# Patient Record
Sex: Male | Born: 1960 | Race: White | Hispanic: No | Marital: Married | Smoking: Never smoker
Health system: Southern US, Community
[De-identification: ages and names within clinical notes are randomized; demographics above are authoritative.]

## PROBLEM LIST (undated history)

## (undated) DIAGNOSIS — I10 Essential (primary) hypertension: Secondary | ICD-10-CM

## (undated) DIAGNOSIS — M1990 Unspecified osteoarthritis, unspecified site: Secondary | ICD-10-CM

## (undated) DIAGNOSIS — E785 Hyperlipidemia, unspecified: Secondary | ICD-10-CM

## (undated) DIAGNOSIS — K219 Gastro-esophageal reflux disease without esophagitis: Secondary | ICD-10-CM

## (undated) DIAGNOSIS — C801 Malignant (primary) neoplasm, unspecified: Secondary | ICD-10-CM

## (undated) HISTORY — DX: Malignant (primary) neoplasm, unspecified: C80.1

## (undated) HISTORY — DX: Hyperlipidemia, unspecified: E78.5

## (undated) HISTORY — DX: Essential (primary) hypertension: I10

## (undated) HISTORY — DX: Unspecified osteoarthritis, unspecified site: M19.90

## (undated) HISTORY — DX: Gastro-esophageal reflux disease without esophagitis: K21.9

---

## 1995-08-01 HISTORY — PX: KNEE SURGERY: SHX244

## 2005-06-28 ENCOUNTER — Ambulatory Visit: Payer: Self-pay | Admitting: Family Medicine

## 2005-07-05 ENCOUNTER — Ambulatory Visit: Payer: Self-pay | Admitting: Family Medicine

## 2005-07-31 HISTORY — PX: PROSTATECTOMY: SHX69

## 2007-01-02 ENCOUNTER — Encounter (INDEPENDENT_AMBULATORY_CARE_PROVIDER_SITE_OTHER): Payer: Self-pay | Admitting: Urology

## 2007-01-02 ENCOUNTER — Inpatient Hospital Stay (HOSPITAL_COMMUNITY): Admission: RE | Admit: 2007-01-02 | Discharge: 2007-01-03 | Payer: Self-pay | Admitting: Urology

## 2007-01-20 ENCOUNTER — Emergency Department (HOSPITAL_COMMUNITY): Admission: EM | Admit: 2007-01-20 | Discharge: 2007-01-20 | Payer: Self-pay | Admitting: Emergency Medicine

## 2008-07-09 IMAGING — CT CT PELVIS W/ CM
2 of 5 series · 16 of 46 positions shown, 18 images · IV contrast (APPLIED)
Comparison: None.

CLINICAL DATA: Acute onset right upper quadrant abdominal pain and nausea.
Rectal pain. History of radical prostatectomy in the past for prostate cancer.

CT ABDOMEN AND PELVIS WITH CONTRAST 01/20/2007:
TECHNIQUE: Multidetector helical CT of the abdomen and pelvis was performed
during bolus administration of intravenous contrast. Oral contrast was given.
Delayed imaging through the kidneys was performed.
Contrast:  125 cc Omnipaque 300.

[Series 2: abd_pel 5.0 b40f st · axial · 0.73mm/px · z∈[-472,-72]mm · 13 of 91 slices shown, 15 images]
[im 6/91  soft-tissue]
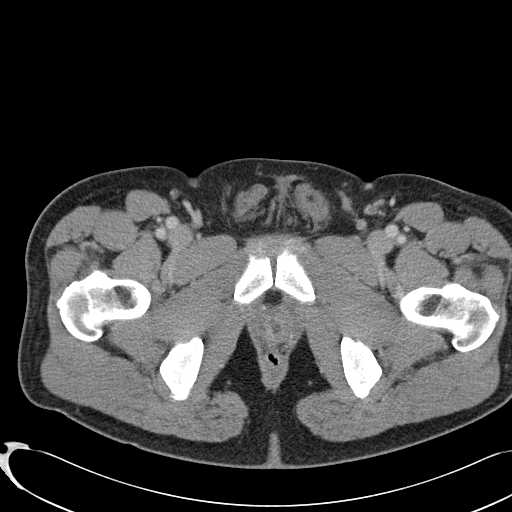
[im 6/91  bone]
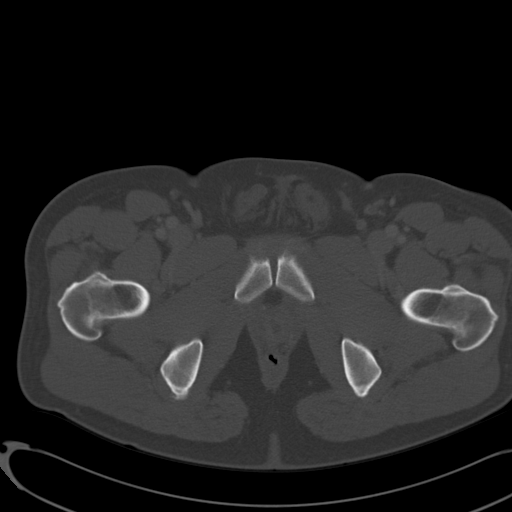
[im 11/91  soft-tissue]
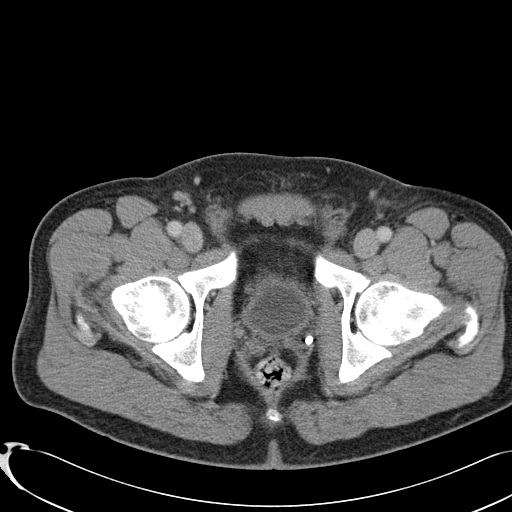
[im 21/91  soft-tissue]
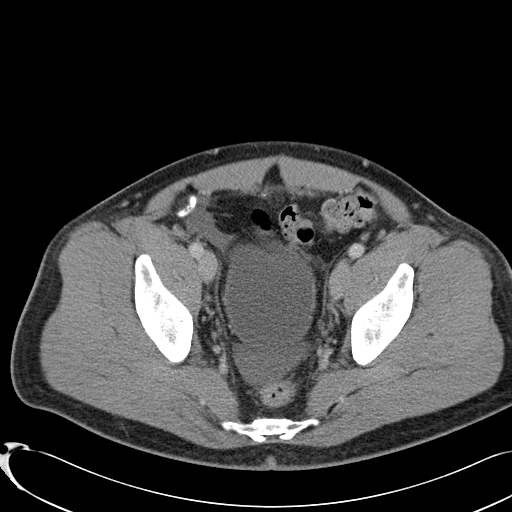
[im 26/91  soft-tissue]
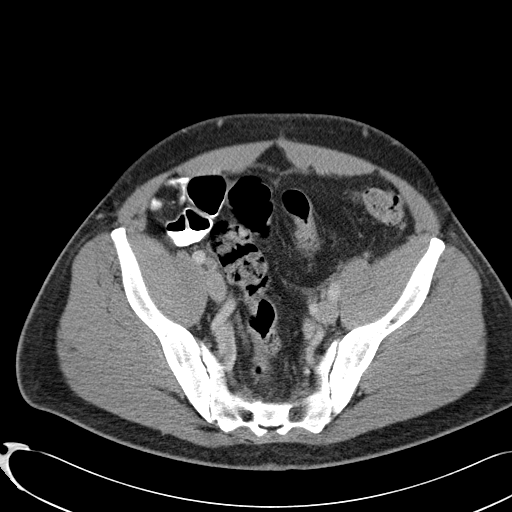
[im 31/91  soft-tissue]
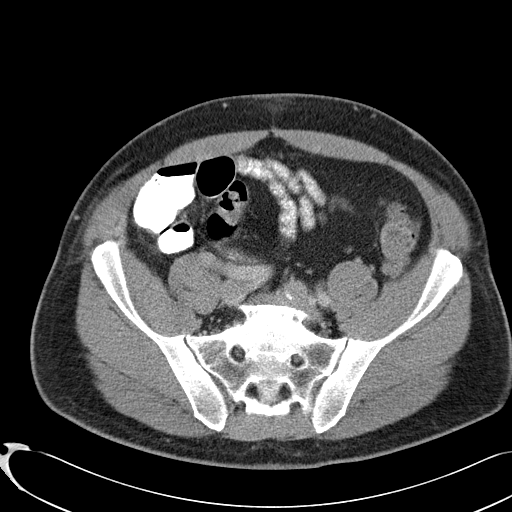
[im 41/91  soft-tissue]
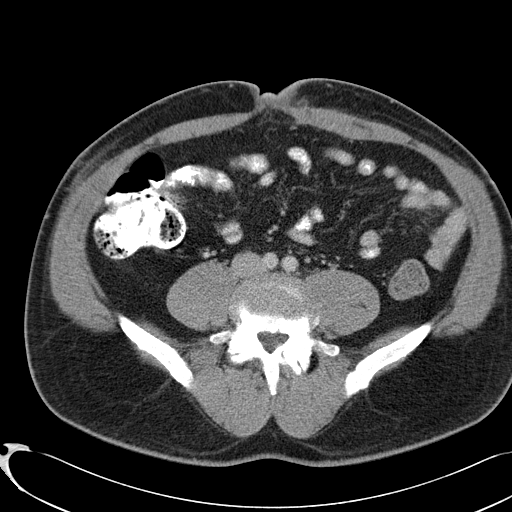
[im 46/91  soft-tissue]
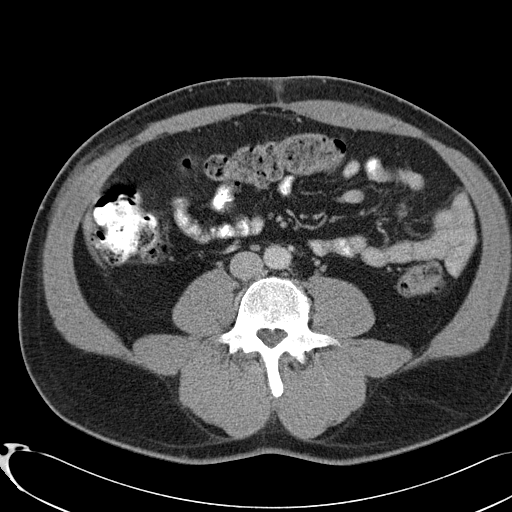
[im 51/91  soft-tissue]
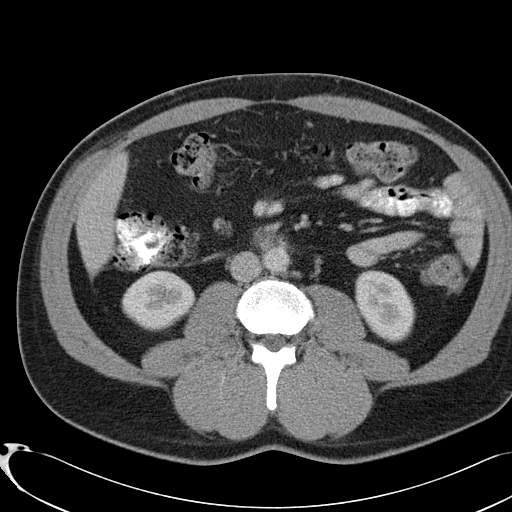
[im 61/91  soft-tissue]
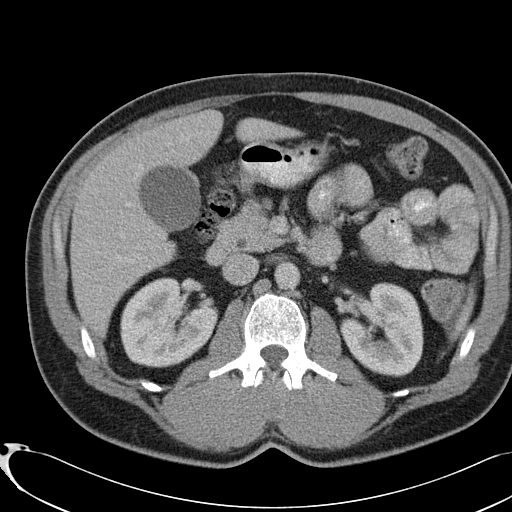
[im 61/91  bone]
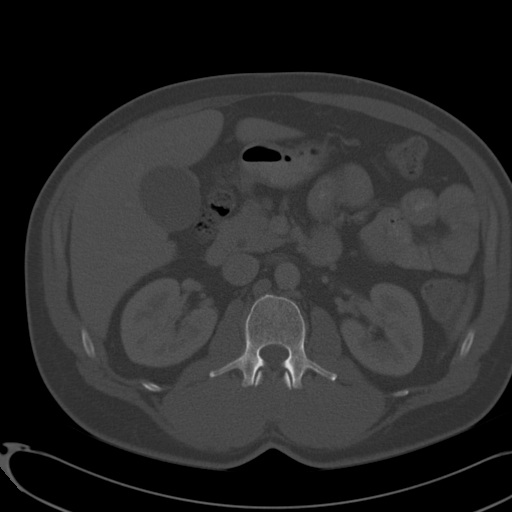
[im 66/91  soft-tissue]
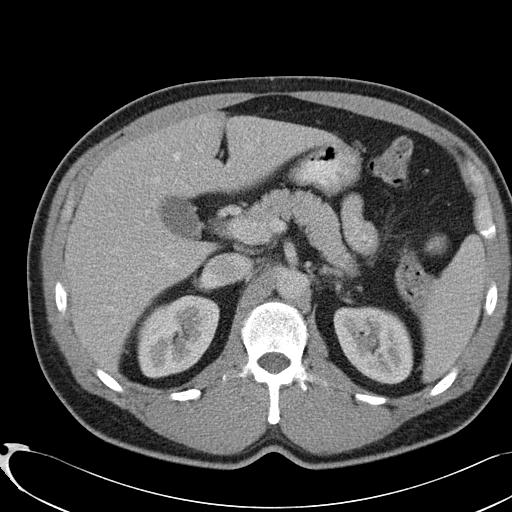
[im 71/91  soft-tissue]
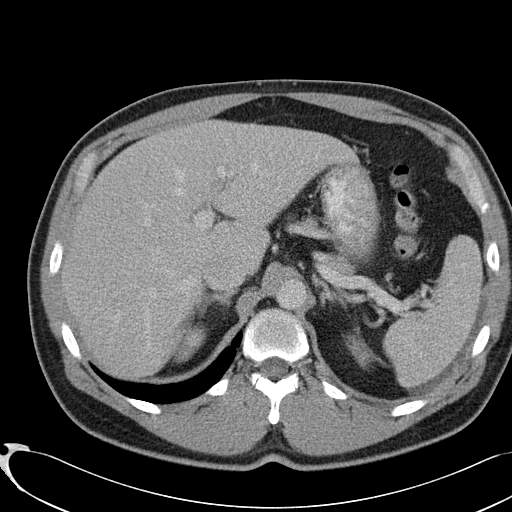
[im 81/91  soft-tissue]
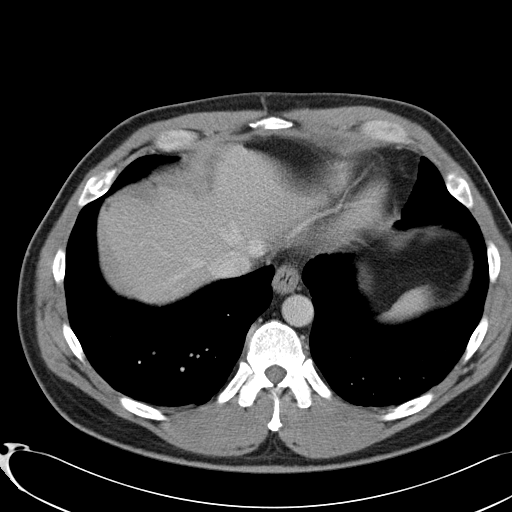
[im 86/91  soft-tissue]
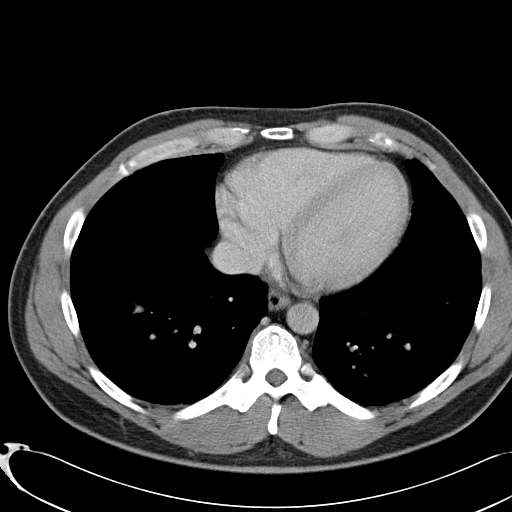

[Series 602: coronal abdomen · coronal · 0.92mm/px · 3 of 139 slices shown]
[im 47/139  soft-tissue]
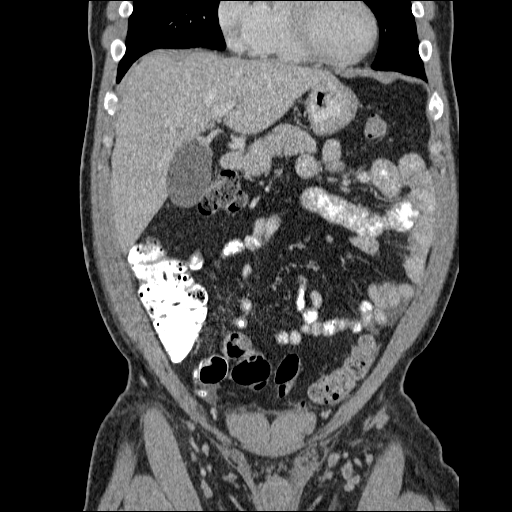
[im 62/139  soft-tissue]
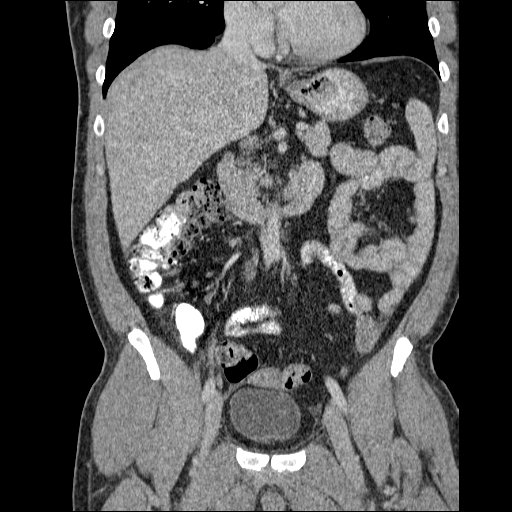
[im 77/139  soft-tissue]
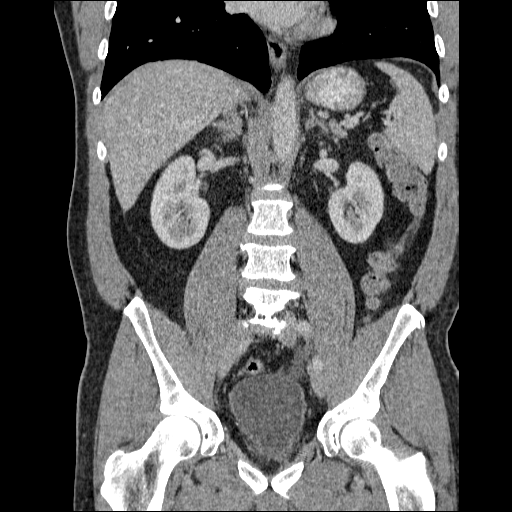

[16 of 46 positions shown; findings below may reference images not displayed]

CT ABDOMEN:

Normal appearing liver, spleen, pancreas, adrenal glands, and kidneys. Small
hiatal hernia. Visualized colon and small bowel unremarkable in the upper
abdomen. No new free fluid. Normal appearing abdominal aorta with widely patent
visceral arteries. Scattered normal size retroperitoneal lymph nodes; no
significant lymphadenopathy. Visualized lung bases clear. Bone window images
demonstrating no evidence of osseous metastatic disease.
IMPRESSION: 1. Normal CT of the abdomen.

CT PELVIS:

Normal appearing appendix identified in the right mid pelvis. Small amount of
free fluid in the right side of the mid pelvis and dependently in the low pelvis
of unclear etiology. Visualized colon and small bowel unremarkable in the
pelvis. No significant pelvic lymphadenopathy. Prostate surgically absent
without evidence of recurrent mass in the prostate bed. Seminal vesicles normal.
Phleboliths in both sides of the pelvis. Bone window images demonstrating
degenerative disc disease and spondylosis at L5-S1 with desiccated disc fragment
on the left consistent with disc extrusion. No evidence of osseous metastatic
disease.
IMPRESSION: 1. Small amount of free fluid in the pelvis of unclear etiology.
2. No evidence of appendicitis or other acute inflammatory process in the
pelvis.
3. Status post prostatectomy with no evidence of recurrent tumor in the prostate
bed. No evidence of the regional nodal metastatic disease. 
4. L5-S1 disc herniation with desiccated disc fragment to the left of midline.

## 2010-12-13 NOTE — Discharge Summary (Signed)
[name], Angel NO.:  [id number]   MEDICAL RECORD NO.:  [id number]          PATIENT TYPE:  INP   LOCATION:  1432                         FACILITY:  [facility]   PHYSICIAN:  Heloise Purpura, MD      DATE OF BIRTH:  10-Mar-1961   DATE OF ADMISSION:  01/02/2007  DATE OF DISCHARGE:  01/03/2007                               DISCHARGE SUMMARY   ADMISSION DIAGNOSIS:  Prostate cancer.   DISCHARGE DIAGNOSIS:  Prostate cancer.   PROCEDURE:  1. Robotic assisted laparoscopic radical prostatectomy.  2. Bilateral pelvic lymphadenectomy.   HISTORY:  For full details please see admission history and physical.  Briefly, [name] is a 50 year old gentleman with clinical stage T1C  prostate cancer with a PSA of 5.3 and Gleason score 4 +3 equals 7.  After discussing management options, he elected to undergo a robotic-  assisted laparoscopic radical prostatectomy.   HOSPITAL COURSE:  On January 02, 2007, the patient was taken to the  operating room and underwent the above procedure.  He tolerated this  well; and without complications.  Postoperatively, he was able to be  transferred to a regular hospital room.  He was able to begin ambulating  the evening of surgery.   On postoperative day #1, he maintained excellent urine output from his  Foley catheter with minimal output from his pelvic drain.  His pelvic  drain was, therefore, removed.  He was begun on clear liquid diet which  he tolerated without problems; and was able to be transitioned to oral  pain medication.  He continued to ambulate well; and tolerated a liquid  diet; and was, therefore, able to be discharged home in excellent  condition on postoperative day #1.  In addition, his hemoglobin was  monitored and was stable on postoperative day #1 at 13.7.   DISPOSITION:  Home.   DISCHARGE MEDICATIONS:  The patient was instructed to resume his regular  home medications excepting any aspirin, nonsteroidal  anti-inflammatory  drugs, or herbal supplements.  He was given a prescription to take  Vicodin as needed for pain, Colace as a stool softener as well as to  begin Cipro 1 day prior to his return visit for Foley catheter removal.  At the patient's request, he was also given a prescription to use Ambien  as needed for insomnia.   DISCHARGE INSTRUCTIONS:  The patient was instructed to be ambulatory but  told to refrain from any heavy lifting, strenuous activity, or driving.  He was instructed on routine Foley catheter care; and given a leg bag  for daytime usage.  In addition, the patient was told to gradually  advance his diet once passing flatus.   FOLLOW UP:  [name] will follow up in 1 week for removal of his Foley  catheter and to discuss his surgical pathology in detail.           ______________________________  Heloise Purpura, MD  Electronically Signed     LB/MEDQ  D:  01/03/2007  T:  01/03/2007  Job:  [id number]

## 2010-12-13 NOTE — Op Note (Signed)
Angel Sampson, Angel Sampson NO.:  [id number]   MEDICAL RECORD NO.:  [id number]          PATIENT TYPE:  INP   LOCATION:  0008                         FACILITY:  [facility]   PHYSICIAN:  Heloise Purpura, MD      DATE OF BIRTH:  1960-12-07   DATE OF PROCEDURE:  01/02/2007  DATE OF DISCHARGE:                               OPERATIVE REPORT   PREOPERATIVE DIAGNOSIS:  Clinically localized adenocarcinoma of  prostate.   POSTOPERATIVE DIAGNOSIS:  Clinically localized adenocarcinoma of  prostate.   PROCEDURE:  1. Robotic assisted laparoscopic radical prostatectomy (bilateral      nerve sparing).  2. Bilateral laparoscopic pelvic lymphadenectomy.   SURGEON:  Heloise Purpura, M.D.   ASSISTANT:  Terie Purser, M.D.   ANESTHESIA:  General.   COMPLICATIONS:  None.   ESTIMATED BLOOD LOSS:  100 mL.   SPECIMENS:  1. Prostate and seminal vesicles.  2. Right pelvic lymph nodes.  3. Left pelvic lymph nodes.   DISPOSITION OF SPECIMEN:  To pathology.   DRAINS:  1. 20 French straight catheter.  2. A 19 Blake pelvic drain.   INDICATION:  Angel Sampson is a 50 year old gentleman with clinical stage  T1C prostate cancer with a PSA of 5.3 and Gleason score 4+3=7.  He did  undergo a bone scan preoperatively which did not demonstrate any obvious  metastatic disease.  After a discussion regarding management options for  clinically localized prostate cancer, he elected to proceed with  surgical therapy and a robotic prostatectomy.  The patient's  pretreatment AUA symptom score was 2 with an IIEF score of 25.  The  potential risks and benefits of the above procedures were discussed with  the patient and informed consent was obtained.   DESCRIPTION OF PROCEDURE:  The patient was taken to the operating room  and a general anesthetic was administered.  He was given preoperative  antibiotics, placed in the dorsal lithotomy position, and prepped and  draped in the usual sterile fashion.  Next, a  preoperative time out was  performed.  A Foley catheter was then inserted into the bladder. A site  was then selected just to the left of the umbilicus for placement of the  camera port.  This was placed using a standard open Hassan technique.  This allowed entry into the peritoneal cavity under direct vision.  A 12  mm port was then placed and a pneumoperitoneum was established.   Inspection of the abdomen revealed no evidence of any intra-abdominal  injuries or other abnormalities.  The remaining ports were then placed.  Bilateral 8 mm robotic ports were placed 10 cm lateral to the camera  port.  An additional 8 mm robotic port was placed in the far left  lateral abdominal wall.  A 5 mm port was placed between the camera port  and the right robotic port.  An additional 12 mm port was placed in the  far right lateral abdominal wall for laparoscopic assistance.  The  surgical cart was then docked.   With the aid of the cautery scissors, the bladder was reflected  posteriorly allowing  entry into the space of Retzius and identification  of the endopelvic fascia and prostate.  The endopelvic fascia was then  incised from the apex back to the base of the prostate bilaterally and  the underlying levator muscle fibers were swept laterally off the  prostate.  This isolated the dorsal venous complex which was then  stapled and divided with a 45 mm flex ETS stapler.  The bladder neck was  identified with the aid of Foley catheter manipulation and divided  anteriorly exposing the Foley catheter.  The catheter balloon was then  deflated and the catheter was brought into the operative field and used  to retract the prostate anteriorly.  The posterior bladder neck was then  examined.  There was no evidence of a median lobe.  The posterior  bladder neck was then divided and dissection continued between the  bladder neck and prostate until the vasa deferentia and seminal vesicles  were identified.   The vasa deferentia were isolated, divided and lifted  anteriorly.  The seminal vesicles were dissected down to their tips and  care was taken to control the seminal vesicle arterial blood supply.  Seminal vesicles were then dissected freely and lifted anteriorly.  The  space between Denonvilliers' fascia and the anterior rectum was then  bluntly developed, thereby isolating the vascular pedicles of the  prostate.  The lateral prostatic fascia was incised bilaterally allowing  the neurovascular bundles to be swept laterally and posteriorly off the  prostate.  The vascular pedicles of the prostate were then ligated above  the level of the neurovascular bundles, thereby, resulting in nerve  preservation.  The nerves were then swept off the apex of the prostate  bilaterally and the urethra was divided allowing the prostate specimen  to be disarticulated.  The pelvis was then copiously irrigated and  hemostasis was insured.  With irrigation in the pelvis, air was injected  into the rectal catheter and there was no evidence of a rectal injury.   Attention was then turned to the right pelvic sidewall.  The fibrofatty  tissue between the external iliac vein, confluence of the iliac vessels,  internal iliac artery, and Cooper's ligament was dissected free from the  pelvic sidewall with care to preserve the obturator nerve.  Hem-A-Lock  clips were used for lymphostasis and hemostasis.  This specimen was then  passed off for permanent pathologic analysis.  An identical procedure  was then performed on the contralateral side.   Attention was then turned to the urethral anastomosis.  A 2-0 Vicryl  slip knot was placed at the 6 o'clock position between the bladder neck  and urethra to reapproximate these structures.  In addition,  Denonvilliers' fascia was also reapproximated to the posterior urethral  tissue.  A double armed 3-0 Monocryl suture was then used to perform a 360 degree running tension  free anastomosis between the bladder neck and  urethra.  A new 20 [place] coude catheter was then inserted into the  bladder.  The catheter was irrigated and the anastomosis appeared to be  watertight and there were no blood clots within the bladder.  The  surgical cart was then undocked.  A #19 Blake pelvic drain was then  brought through the left robotic port and appropriately positioned in  the pelvis.  It was secured to the skin with a nylon suture.  The  surgical cart was then undocked.  A 0 Vicryl suture was used to close  the right lateral 12  mm port site with the aid of the suture passer  device.  The prostate specimen was removed via the periumbilical port  site intact within the Endopouch retrieval bag.  This fascial opening  was then closed with a running 0 Vicryl suture.  All ports, otherwise,  were removed under direct vision.  All port sites were then injected  with 0.25% Marcaine and  reapproximated at the skin level with staples.  Sterile dressings were  applied.  The patient appeared to tolerate the procedure well without  complications.  He was able to be extubated and transferred to the  recovery unit in satisfactory condition.           ______________________________  Heloise Purpura, MD  Electronically Signed     LB/MEDQ  D:  01/02/2007  T:  01/02/2007  Job:  [id number]

## 2011-05-17 LAB — BASIC METABOLIC PANEL WITH GFR
BUN: 9
CO2: 31
Calcium: 9
Chloride: 102
Creatinine, Ser: 0.78
GFR calc non Af Amer: >60
Glucose, Bld: 98
Potassium: 3.6
Sodium: 139

## 2011-05-17 LAB — CBC
HCT: 44.1
Hemoglobin: 15
MCHC: 33.9
MCV: 86.3
Platelets: 302
RBC: 5.11
RDW: 13.2
WBC: 7.2

## 2011-05-17 LAB — URINALYSIS, ROUTINE W REFLEX MICROSCOPIC
Bilirubin Urine: NEGATIVE
Glucose, UA: NEGATIVE
Ketones, ur: NEGATIVE
Leukocytes, UA: NEGATIVE
Nitrite: NEGATIVE
Protein, ur: NEGATIVE
Specific Gravity, Urine: 1.005
Urobilinogen, UA: 0.2
pH: 7

## 2011-05-17 LAB — DIFFERENTIAL
Basophils Absolute: 0
Basophils Relative: 0
Eosinophils Absolute: 0.1
Eosinophils Relative: 1
Lymphocytes Relative: 22
Lymphs Abs: 1.6
Monocytes Absolute: 0.5
Monocytes Relative: 7
Neutro Abs: 4.9
Neutrophils Relative %: 69

## 2011-05-17 LAB — URINE MICROSCOPIC-ADD ON: Urine-Other: NONE SEEN

## 2011-05-18 LAB — HEMOGLOBIN AND HEMATOCRIT, BLOOD
HCT: 40
HCT: 42.4
Hemoglobin: 13.7
Hemoglobin: 14.6

## 2011-05-18 LAB — TYPE AND SCREEN
ABO/RH(D): AB POS
Antibody Screen: NEGATIVE

## 2011-05-18 LAB — ABO/RH: ABO/RH(D): AB POS

## 2015-12-08 DIAGNOSIS — E785 Hyperlipidemia, unspecified: Secondary | ICD-10-CM | POA: Diagnosis not present

## 2015-12-08 DIAGNOSIS — I10 Essential (primary) hypertension: Secondary | ICD-10-CM | POA: Diagnosis not present

## 2015-12-08 DIAGNOSIS — Z6830 Body mass index (BMI) 30.0-30.9, adult: Secondary | ICD-10-CM | POA: Diagnosis not present

## 2016-01-19 DIAGNOSIS — M5136 Other intervertebral disc degeneration, lumbar region: Secondary | ICD-10-CM | POA: Diagnosis not present

## 2016-01-19 DIAGNOSIS — M9902 Segmental and somatic dysfunction of thoracic region: Secondary | ICD-10-CM | POA: Diagnosis not present

## 2016-01-19 DIAGNOSIS — S29019A Strain of muscle and tendon of unspecified wall of thorax, initial encounter: Secondary | ICD-10-CM | POA: Diagnosis not present

## 2016-01-19 DIAGNOSIS — M9903 Segmental and somatic dysfunction of lumbar region: Secondary | ICD-10-CM | POA: Diagnosis not present

## 2016-01-20 DIAGNOSIS — M5136 Other intervertebral disc degeneration, lumbar region: Secondary | ICD-10-CM | POA: Diagnosis not present

## 2016-01-20 DIAGNOSIS — S29019A Strain of muscle and tendon of unspecified wall of thorax, initial encounter: Secondary | ICD-10-CM | POA: Diagnosis not present

## 2016-01-20 DIAGNOSIS — M9903 Segmental and somatic dysfunction of lumbar region: Secondary | ICD-10-CM | POA: Diagnosis not present

## 2016-01-20 DIAGNOSIS — M9902 Segmental and somatic dysfunction of thoracic region: Secondary | ICD-10-CM | POA: Diagnosis not present

## 2016-01-24 DIAGNOSIS — M9902 Segmental and somatic dysfunction of thoracic region: Secondary | ICD-10-CM | POA: Diagnosis not present

## 2016-01-24 DIAGNOSIS — S29019A Strain of muscle and tendon of unspecified wall of thorax, initial encounter: Secondary | ICD-10-CM | POA: Diagnosis not present

## 2016-01-24 DIAGNOSIS — M9903 Segmental and somatic dysfunction of lumbar region: Secondary | ICD-10-CM | POA: Diagnosis not present

## 2016-01-24 DIAGNOSIS — M5136 Other intervertebral disc degeneration, lumbar region: Secondary | ICD-10-CM | POA: Diagnosis not present

## 2016-01-25 DIAGNOSIS — M9903 Segmental and somatic dysfunction of lumbar region: Secondary | ICD-10-CM | POA: Diagnosis not present

## 2016-01-25 DIAGNOSIS — S29019A Strain of muscle and tendon of unspecified wall of thorax, initial encounter: Secondary | ICD-10-CM | POA: Diagnosis not present

## 2016-01-25 DIAGNOSIS — M5136 Other intervertebral disc degeneration, lumbar region: Secondary | ICD-10-CM | POA: Diagnosis not present

## 2016-01-25 DIAGNOSIS — M9902 Segmental and somatic dysfunction of thoracic region: Secondary | ICD-10-CM | POA: Diagnosis not present

## 2016-01-27 DIAGNOSIS — M9903 Segmental and somatic dysfunction of lumbar region: Secondary | ICD-10-CM | POA: Diagnosis not present

## 2016-01-27 DIAGNOSIS — M5136 Other intervertebral disc degeneration, lumbar region: Secondary | ICD-10-CM | POA: Diagnosis not present

## 2016-01-27 DIAGNOSIS — M9902 Segmental and somatic dysfunction of thoracic region: Secondary | ICD-10-CM | POA: Diagnosis not present

## 2016-01-27 DIAGNOSIS — S29019A Strain of muscle and tendon of unspecified wall of thorax, initial encounter: Secondary | ICD-10-CM | POA: Diagnosis not present

## 2016-02-03 DIAGNOSIS — M9903 Segmental and somatic dysfunction of lumbar region: Secondary | ICD-10-CM | POA: Diagnosis not present

## 2016-02-03 DIAGNOSIS — M9902 Segmental and somatic dysfunction of thoracic region: Secondary | ICD-10-CM | POA: Diagnosis not present

## 2016-02-03 DIAGNOSIS — M5136 Other intervertebral disc degeneration, lumbar region: Secondary | ICD-10-CM | POA: Diagnosis not present

## 2016-02-03 DIAGNOSIS — S29019A Strain of muscle and tendon of unspecified wall of thorax, initial encounter: Secondary | ICD-10-CM | POA: Diagnosis not present

## 2016-02-07 DIAGNOSIS — M9903 Segmental and somatic dysfunction of lumbar region: Secondary | ICD-10-CM | POA: Diagnosis not present

## 2016-02-07 DIAGNOSIS — M9902 Segmental and somatic dysfunction of thoracic region: Secondary | ICD-10-CM | POA: Diagnosis not present

## 2016-02-07 DIAGNOSIS — M5136 Other intervertebral disc degeneration, lumbar region: Secondary | ICD-10-CM | POA: Diagnosis not present

## 2016-02-07 DIAGNOSIS — S29019A Strain of muscle and tendon of unspecified wall of thorax, initial encounter: Secondary | ICD-10-CM | POA: Diagnosis not present

## 2016-02-08 DIAGNOSIS — M9903 Segmental and somatic dysfunction of lumbar region: Secondary | ICD-10-CM | POA: Diagnosis not present

## 2016-02-08 DIAGNOSIS — M9902 Segmental and somatic dysfunction of thoracic region: Secondary | ICD-10-CM | POA: Diagnosis not present

## 2016-02-08 DIAGNOSIS — M5136 Other intervertebral disc degeneration, lumbar region: Secondary | ICD-10-CM | POA: Diagnosis not present

## 2016-02-08 DIAGNOSIS — S29019A Strain of muscle and tendon of unspecified wall of thorax, initial encounter: Secondary | ICD-10-CM | POA: Diagnosis not present

## 2016-02-16 DIAGNOSIS — M9902 Segmental and somatic dysfunction of thoracic region: Secondary | ICD-10-CM | POA: Diagnosis not present

## 2016-02-16 DIAGNOSIS — S29019A Strain of muscle and tendon of unspecified wall of thorax, initial encounter: Secondary | ICD-10-CM | POA: Diagnosis not present

## 2016-02-16 DIAGNOSIS — M9903 Segmental and somatic dysfunction of lumbar region: Secondary | ICD-10-CM | POA: Diagnosis not present

## 2016-02-16 DIAGNOSIS — M5136 Other intervertebral disc degeneration, lumbar region: Secondary | ICD-10-CM | POA: Diagnosis not present

## 2016-02-22 DIAGNOSIS — M5136 Other intervertebral disc degeneration, lumbar region: Secondary | ICD-10-CM | POA: Diagnosis not present

## 2016-02-22 DIAGNOSIS — M9903 Segmental and somatic dysfunction of lumbar region: Secondary | ICD-10-CM | POA: Diagnosis not present

## 2016-02-22 DIAGNOSIS — S29019A Strain of muscle and tendon of unspecified wall of thorax, initial encounter: Secondary | ICD-10-CM | POA: Diagnosis not present

## 2016-02-22 DIAGNOSIS — M9902 Segmental and somatic dysfunction of thoracic region: Secondary | ICD-10-CM | POA: Diagnosis not present

## 2016-03-02 DIAGNOSIS — M9903 Segmental and somatic dysfunction of lumbar region: Secondary | ICD-10-CM | POA: Diagnosis not present

## 2016-03-02 DIAGNOSIS — S29019A Strain of muscle and tendon of unspecified wall of thorax, initial encounter: Secondary | ICD-10-CM | POA: Diagnosis not present

## 2016-03-02 DIAGNOSIS — M9902 Segmental and somatic dysfunction of thoracic region: Secondary | ICD-10-CM | POA: Diagnosis not present

## 2016-03-02 DIAGNOSIS — M5136 Other intervertebral disc degeneration, lumbar region: Secondary | ICD-10-CM | POA: Diagnosis not present

## 2016-03-10 DIAGNOSIS — M5136 Other intervertebral disc degeneration, lumbar region: Secondary | ICD-10-CM | POA: Diagnosis not present

## 2016-03-10 DIAGNOSIS — M9903 Segmental and somatic dysfunction of lumbar region: Secondary | ICD-10-CM | POA: Diagnosis not present

## 2016-03-10 DIAGNOSIS — S29019A Strain of muscle and tendon of unspecified wall of thorax, initial encounter: Secondary | ICD-10-CM | POA: Diagnosis not present

## 2016-03-10 DIAGNOSIS — M9902 Segmental and somatic dysfunction of thoracic region: Secondary | ICD-10-CM | POA: Diagnosis not present

## 2016-06-06 DIAGNOSIS — D225 Melanocytic nevi of trunk: Secondary | ICD-10-CM | POA: Diagnosis not present

## 2016-06-06 DIAGNOSIS — L821 Other seborrheic keratosis: Secondary | ICD-10-CM | POA: Diagnosis not present

## 2016-06-30 ENCOUNTER — Ambulatory Visit (INDEPENDENT_AMBULATORY_CARE_PROVIDER_SITE_OTHER): Payer: BLUE CROSS/BLUE SHIELD | Admitting: Sports Medicine

## 2016-06-30 ENCOUNTER — Encounter: Payer: Self-pay | Admitting: Sports Medicine

## 2016-06-30 DIAGNOSIS — L600 Ingrowing nail: Secondary | ICD-10-CM

## 2016-06-30 DIAGNOSIS — M79674 Pain in right toe(s): Secondary | ICD-10-CM

## 2016-06-30 MED ORDER — AMOXICILLIN-POT CLAVULANATE 875-125 MG PO TABS: 1.0000 | ORAL_TABLET | Freq: Two times a day (BID) | ORAL | 0 refills | Status: DC

## 2016-06-30 NOTE — Progress Notes (Signed)
Subjective: Angel Sampson is a 55 y.o. male patient presents to office today complaining of a painful incurvated, red, hot, swollen lateral nail border of the 1st toe on the right foot. This has been present for >1 week. Patient has treated this by soaking and trimming. Patient denies fever/chills/nausea/vomitting/any other related constitutional symptoms at this time.  There are no active problems to display for this patient.   No current outpatient prescriptions on file prior to visit.   No current facility-administered medications on file prior to visit.     Not on File  Objective:  There were no vitals filed for this visit.  General: Well developed, nourished, in no acute distress, alert and oriented x3   Dermatology: Skin is warm, dry and supple bilateral. Right hallux nail appears to be severely incurvated with hyperkeratosis formation at the distal aspects of the lateral nail border. (+) Erythema. (+) Edema. (+) serosanguous drainage present. The remaining nails appear unremarkable at this time. There are no open sores, lesions or other signs of infection  present.  Vascular: Dorsalis Pedis artery and Posterior Tibial artery pedal pulses are 2/4 bilateral with immedate capillary fill time. Pedal hair growth present. No lower extremity edema.   Neruologic: Grossly intact via light touch bilateral.  Musculoskeletal: Tenderness to palpation of the Right hallux lateral nail fold(s). Muscular strength within normal limits in all groups bilateral.   Assesement and Plan: Problem List Items Addressed This Visit    None    Visit Diagnoses    Ingrown nail    -  Primary   Right hallux lateral margin   Relevant Medications   amoxicillin-clavulanate (AUGMENTIN) 875-125 MG tablet   Toe pain, right       Relevant Medications   amoxicillin-clavulanate (AUGMENTIN) 875-125 MG tablet      -Discussed treatment alternatives and plan of care; Explained permanent/temporary nail avulsion and  post procedure course to patient. Patient opt for PNA right hallux lateral margin. - After a verbal consent, injected 3 ml of a 50:50 mixture of 2% plain  lidocaine and 0.5% plain marcaine in a normal hallux block fashion. Next, a betadine prep was performed. Anesthesia was tested and found to be appropriate.  The offending Right hallux lateral nail border was then incised from the hyponychium to the epinychium. The offending nail border was removed and cleared from the field. The area was curretted for any remaining nail or spicules. Phenol application performed and the area was then flushed with alcohol and dressed with antibiotic cream and a dry sterile dressing. -Patient was instructed to leave the dressing intact for today and begin soaking in a weak solution of betadine and water tomorrow. Patient was instructed to soak for 15 minutes each day and apply neosporin and a gauze or bandaid dressing each day. -Rx Augmentin for preventative measures in setting of local infection -Patient was instructed to monitor the toe for signs of infection and return to office if toe becomes more red, hot or swollen. -Advised ice, elevation, and tylenol or motrin if needed for pain.  -Patient is to return in 2 weeks for follow up care/nail check or sooner if problems arise.  Landis Martins, DPM

## 2016-06-30 NOTE — Patient Instructions (Signed)

## 2016-07-19 ENCOUNTER — Ambulatory Visit: Payer: BLUE CROSS/BLUE SHIELD | Admitting: Sports Medicine

## 2017-01-17 DIAGNOSIS — E785 Hyperlipidemia, unspecified: Secondary | ICD-10-CM | POA: Diagnosis not present

## 2017-01-17 DIAGNOSIS — I10 Essential (primary) hypertension: Secondary | ICD-10-CM | POA: Diagnosis not present

## 2017-01-17 DIAGNOSIS — Z0000 Encounter for general adult medical examination without abnormal findings: Secondary | ICD-10-CM | POA: Diagnosis not present

## 2017-01-17 DIAGNOSIS — B079 Viral wart, unspecified: Secondary | ICD-10-CM | POA: Diagnosis not present

## 2017-01-17 DIAGNOSIS — Z23 Encounter for immunization: Secondary | ICD-10-CM | POA: Diagnosis not present

## 2017-05-07 DIAGNOSIS — L820 Inflamed seborrheic keratosis: Secondary | ICD-10-CM | POA: Diagnosis not present

## 2017-05-07 DIAGNOSIS — B079 Viral wart, unspecified: Secondary | ICD-10-CM | POA: Diagnosis not present

## 2017-05-07 DIAGNOSIS — L821 Other seborrheic keratosis: Secondary | ICD-10-CM | POA: Diagnosis not present

## 2017-10-08 DIAGNOSIS — Z13220 Encounter for screening for lipoid disorders: Secondary | ICD-10-CM | POA: Diagnosis not present

## 2017-10-08 DIAGNOSIS — Z125 Encounter for screening for malignant neoplasm of prostate: Secondary | ICD-10-CM | POA: Diagnosis not present

## 2017-10-08 DIAGNOSIS — Z0000 Encounter for general adult medical examination without abnormal findings: Secondary | ICD-10-CM | POA: Diagnosis not present

## 2017-10-08 DIAGNOSIS — Z1331 Encounter for screening for depression: Secondary | ICD-10-CM | POA: Diagnosis not present

## 2018-02-07 DIAGNOSIS — M1712 Unilateral primary osteoarthritis, left knee: Secondary | ICD-10-CM | POA: Diagnosis not present

## 2018-07-20 ENCOUNTER — Other Ambulatory Visit: Payer: Self-pay

## 2018-07-20 ENCOUNTER — Emergency Department (HOSPITAL_BASED_OUTPATIENT_CLINIC_OR_DEPARTMENT_OTHER): Payer: Worker's Compensation

## 2018-07-20 ENCOUNTER — Emergency Department (HOSPITAL_BASED_OUTPATIENT_CLINIC_OR_DEPARTMENT_OTHER)
Admission: EM | Admit: 2018-07-20 | Discharge: 2018-07-20 | Disposition: A | Discharge status: Home / Self Care | Payer: Worker's Compensation | Attending: Emergency Medicine | Admitting: Emergency Medicine

## 2018-07-20 ENCOUNTER — Encounter (HOSPITAL_BASED_OUTPATIENT_CLINIC_OR_DEPARTMENT_OTHER): Payer: Self-pay | Admitting: Emergency Medicine

## 2018-07-20 DIAGNOSIS — S62664B Nondisplaced fracture of distal phalanx of right ring finger, initial encounter for open fracture: Secondary | ICD-10-CM | POA: Insufficient documentation

## 2018-07-20 DIAGNOSIS — S61214A Laceration without foreign body of right ring finger without damage to nail, initial encounter: Secondary | ICD-10-CM | POA: Diagnosis not present

## 2018-07-20 DIAGNOSIS — Y999 Unspecified external cause status: Secondary | ICD-10-CM | POA: Insufficient documentation

## 2018-07-20 DIAGNOSIS — W458XXA Other foreign body or object entering through skin, initial encounter: Secondary | ICD-10-CM | POA: Diagnosis not present

## 2018-07-20 DIAGNOSIS — Y929 Unspecified place or not applicable: Secondary | ICD-10-CM | POA: Diagnosis not present

## 2018-07-20 DIAGNOSIS — Y939 Activity, unspecified: Secondary | ICD-10-CM | POA: Diagnosis not present

## 2018-07-20 DIAGNOSIS — Z79899 Other long term (current) drug therapy: Secondary | ICD-10-CM | POA: Insufficient documentation

## 2018-07-20 DIAGNOSIS — S6991XA Unspecified injury of right wrist, hand and finger(s), initial encounter: Secondary | ICD-10-CM | POA: Diagnosis present

## 2018-07-20 DIAGNOSIS — S62639B Displaced fracture of distal phalanx of unspecified finger, initial encounter for open fracture: Secondary | ICD-10-CM

## 2018-07-20 MED ORDER — LIDOCAINE HCL 1 % IJ SOLN
INTRAMUSCULAR | Status: AC
  Filled 2018-07-20: qty 20

## 2018-07-20 MED ORDER — LIDOCAINE HCL (PF) 1 % IJ SOLN
INTRAMUSCULAR | Status: AC
  Filled 2018-07-20: qty 10

## 2018-07-20 MED ORDER — CEPHALEXIN 500 MG PO CAPS: 500.0000 mg | ORAL_CAPSULE | Freq: Four times a day (QID) | ORAL | 0 refills | Status: AC

## 2018-07-20 MED ORDER — LIDOCAINE HCL (PF) 1 % IJ SOLN
30.0000 mL | Freq: Once | INTRAMUSCULAR | Status: AC
  Administered 2018-07-20: 30 mL
  Filled 2018-07-20: qty 30

## 2018-07-20 NOTE — Discharge Instructions (Signed)
Return in 7 to 10 days for suture removal. Return sooner for signs of infection including redness, increased swelling, pus draining from the area or fever.  Take your antibiotics as prescribed.

## 2018-07-20 NOTE — ED Triage Notes (Signed)
Pt c/o right ring finger avulsion while trying to catch himself from falling off a ladder.

## 2018-07-20 NOTE — ED Notes (Signed)
Patient transported to X-ray 

## 2018-07-20 NOTE — ED Provider Notes (Signed)
[facility] EMERGENCY DEPARTMENT Provider Note   CSN: [id number] Arrival date & time: 07/20/18  1127     History   Chief Complaint Chief Complaint  Patient presents with  . Finger Injury    HPI Angel Sampson is a 57 y.o. male who presents to ED for nondominant right fourth digit laceration that occurred prior to arrival.  States that he was on a ladder when he missed 1 of the rungs.  He tried to catch himself from falling and feels that he cut his finger on a part of the ladder.  Bleeding has been controlled with pressure.  States that his last tetanus was within the past 5 years.  He denies any anticoagulant use, changes to sensation or other symptoms.  HPI  History reviewed. No pertinent past medical history.  There are no active problems to display for this patient.   History reviewed. No pertinent surgical history.      Home Medications    Prior to Admission medications   Medication Sig Start Date End Date Taking? Authorizing Provider  amoxicillin-clavulanate (AUGMENTIN) 875-125 MG tablet Take 1 tablet by mouth 2 (two) times daily. 06/30/16   Landis Martins, DPM  cephALEXin (KEFLEX) 500 MG capsule Take 1 capsule (500 mg total) by mouth 4 (four) times daily for 7 days. 07/20/18 07/27/18  Deborh Pense, PA-C  FLUARIX QUADRIVALENT 0.5 ML injection TO BE ADMINISTERED BY PHARMACIST FOR IMMUNIZATION 05/20/16   [provider]    Family History History reviewed. No pertinent family history.  Social History Social History   Tobacco Use  . Smoking status: Unknown If Ever Smoked  Substance Use Topics  . Alcohol use: Not on file  . Drug use: Not on file     Allergies   Patient has no known allergies.   Review of Systems Review of Systems  Constitutional: Negative for chills and fever.  Skin: Positive for wound.  Neurological: Negative for weakness and numbness.     Physical Exam Updated Vital Signs BP (!) 158/100 (BP Location: Left Arm)    Pulse 98   Temp 97.7 F (36.5 C) (Oral)   Resp 18   Ht 5\' 11"  (1.803 m)   Wt 97.5 kg   SpO2 100%   BMI 29.99 kg/m   Physical Exam Vitals signs and nursing note reviewed.  Constitutional:      General: He is not in acute distress.    Appearance: He is well-developed. He is not diaphoretic.  HENT:     Head: Normocephalic and atraumatic.  Eyes:     General: No scleral icterus.    Conjunctiva/sclera: Conjunctivae normal.  Neck:     Musculoskeletal: Normal range of motion.  Pulmonary:     Effort: Pulmonary effort is normal. No respiratory distress.  Skin:    Findings: Laceration present. No rash.     Comments: V-shaped laceration of the dorsum of the right fourth digit.  There is a skin avulsion noted on the same digit as well.  No nailbed involvement noted.  Full active and passive range of motion of digits without difficulty.  Sensation intact to light touch of digits.  2+ radial pulse palpated.  Neurological:     Mental Status: He is alert.        ED Treatments / Results  Labs (all labs ordered are listed, but only abnormal results are displayed) Labs Reviewed - No data to display  EKG None  Radiology Dg Finger Ring Right  Result Date: 07/20/2018 CLINICAL  DATA:  Avulsion injury to right ring finger today while falling from ladder. EXAM: RIGHT RING FINGER 2+V COMPARISON:  None. FINDINGS: Fracture with minimal displacement involving the distal tuft of the fourth distal phalanx. Remainder the exam is unremarkable. IMPRESSION: Minimally displaced fracture of the distal tuft of the fourth distal phalanx. Electronically Signed   By: Marin Olp M.D.   On: 07/20/2018 12:00    Procedures .Marland KitchenLaceration Repair Date/Time: 07/20/2018 12:38 PM Performed by: Delia Heady, PA-C Authorized by: Delia Heady, PA-C   Consent:    Consent obtained:  Verbal   Consent given by:  Patient   Risks discussed:  Infection, nerve damage, need for additional repair, pain, poor cosmetic  result, poor wound healing, retained foreign body, tendon damage and vascular damage Anesthesia (see MAR for exact dosages):    Anesthesia method:  Local infiltration   Local anesthetic:  Lidocaine 1% w/o epi Laceration details:    Location:  Finger   Finger location:  R ring finger   Length (cm):  3 Repair type:    Repair type:  Simple Exploration:    Hemostasis achieved with:  Direct pressure Treatment:    Area cleansed with:  Saline   Amount of cleaning:  Extensive   Irrigation solution:  Sterile saline   Irrigation method:  Pressure wash Skin repair:    Repair method:  Sutures   Suture size:  5-0   Suture material:  Nylon   Suture technique:  Simple interrupted   Number of sutures:  6 Approximation:    Approximation:  Close Post-procedure details:    Dressing:  Antibiotic ointment   Patient tolerance of procedure:  Tolerated well, no immediate complications   (including critical care time)  Medications Ordered in ED Medications  lidocaine (PF) (XYLOCAINE) 1 % injection 30 mL (has no administration in time range)  lidocaine (PF) (XYLOCAINE) 1 % injection (has no administration in time range)     Initial Impression / Assessment and Plan / ED Course  I have reviewed the triage vital signs and the nursing notes.  Pertinent labs & imaging results that were available during my care of the patient were reviewed by me and considered in my medical decision making (see chart for details).     Patient counseled on wound care. Patient counseled on need to return or see PCP/urgent care for suture removal in 7-10 days.  X-rays shows distal tuft fracture.  He will be placed on antibiotic and provided with hand orthopedic follow-up.  Finger was splinted.  Patient was urged to return to the Emergency Department urgently with worsening pain, swelling, expanding erythema especially if it streaks away from the affected area, fever, or if they have any other concerns. Patient verbalized  understanding.   Patient is hemodynamically stable, in NAD, and able to ambulate in the ED. Evaluation does not show pathology that would require ongoing emergent intervention or inpatient treatment. I explained the diagnosis to the patient. Pain has been managed and has no complaints prior to discharge. Patient is comfortable with above plan and is stable for discharge at this time. All questions were answered prior to disposition. Strict return precautions for returning to the ED were discussed. Encouraged follow up with PCP.    Portions of this note were generated with Lobbyist. Dictation errors may occur despite best attempts at proofreading.   Final Clinical Impressions(s) / ED Diagnoses   Final diagnoses:  Open fracture of tuft of distal phalanx of finger  Laceration of  right ring finger without foreign body without damage to nail, initial encounter    ED Discharge Orders         Ordered    cephALEXin (KEFLEX) 500 MG capsule  4 times daily     07/20/18 1239           Delia Heady, PA-C 07/20/18 1240    Duffy Bruce, MD 07/20/18 1945

## 2018-08-05 DIAGNOSIS — Z8546 Personal history of malignant neoplasm of prostate: Secondary | ICD-10-CM | POA: Diagnosis not present

## 2018-08-05 DIAGNOSIS — I10 Essential (primary) hypertension: Secondary | ICD-10-CM | POA: Diagnosis not present

## 2018-08-05 DIAGNOSIS — E785 Hyperlipidemia, unspecified: Secondary | ICD-10-CM | POA: Diagnosis not present

## 2018-08-05 DIAGNOSIS — K219 Gastro-esophageal reflux disease without esophagitis: Secondary | ICD-10-CM | POA: Diagnosis not present

## 2018-10-28 DIAGNOSIS — Z13220 Encounter for screening for lipoid disorders: Secondary | ICD-10-CM | POA: Diagnosis not present

## 2018-10-28 DIAGNOSIS — Z125 Encounter for screening for malignant neoplasm of prostate: Secondary | ICD-10-CM | POA: Diagnosis not present

## 2018-10-28 DIAGNOSIS — Z0000 Encounter for general adult medical examination without abnormal findings: Secondary | ICD-10-CM | POA: Diagnosis not present

## 2018-10-29 DIAGNOSIS — Z0000 Encounter for general adult medical examination without abnormal findings: Secondary | ICD-10-CM | POA: Diagnosis not present

## 2018-10-29 DIAGNOSIS — I10 Essential (primary) hypertension: Secondary | ICD-10-CM | POA: Diagnosis not present

## 2018-10-29 DIAGNOSIS — Z1331 Encounter for screening for depression: Secondary | ICD-10-CM | POA: Diagnosis not present

## 2018-10-29 DIAGNOSIS — E782 Mixed hyperlipidemia: Secondary | ICD-10-CM | POA: Diagnosis not present

## 2018-10-30 DIAGNOSIS — Z1159 Encounter for screening for other viral diseases: Secondary | ICD-10-CM | POA: Diagnosis not present

## 2018-12-28 DIAGNOSIS — L299 Pruritus, unspecified: Secondary | ICD-10-CM | POA: Diagnosis not present

## 2018-12-28 DIAGNOSIS — L300 Nummular dermatitis: Secondary | ICD-10-CM | POA: Diagnosis not present

## 2018-12-28 DIAGNOSIS — B079 Viral wart, unspecified: Secondary | ICD-10-CM | POA: Diagnosis not present

## 2018-12-30 DIAGNOSIS — M1712 Unilateral primary osteoarthritis, left knee: Secondary | ICD-10-CM | POA: Diagnosis not present

## 2019-03-05 DIAGNOSIS — B079 Viral wart, unspecified: Secondary | ICD-10-CM | POA: Diagnosis not present

## 2019-03-26 DIAGNOSIS — L570 Actinic keratosis: Secondary | ICD-10-CM | POA: Diagnosis not present

## 2019-03-26 DIAGNOSIS — B079 Viral wart, unspecified: Secondary | ICD-10-CM | POA: Diagnosis not present

## 2019-10-30 DIAGNOSIS — Z1331 Encounter for screening for depression: Secondary | ICD-10-CM | POA: Diagnosis not present

## 2019-10-30 DIAGNOSIS — E669 Obesity, unspecified: Secondary | ICD-10-CM | POA: Diagnosis not present

## 2019-10-30 DIAGNOSIS — Z0000 Encounter for general adult medical examination without abnormal findings: Secondary | ICD-10-CM | POA: Diagnosis not present

## 2019-10-30 DIAGNOSIS — Z6832 Body mass index (BMI) 32.0-32.9, adult: Secondary | ICD-10-CM | POA: Diagnosis not present

## 2019-12-02 DIAGNOSIS — Z131 Encounter for screening for diabetes mellitus: Secondary | ICD-10-CM | POA: Diagnosis not present

## 2019-12-02 DIAGNOSIS — Z13220 Encounter for screening for lipoid disorders: Secondary | ICD-10-CM | POA: Diagnosis not present

## 2019-12-02 DIAGNOSIS — Z125 Encounter for screening for malignant neoplasm of prostate: Secondary | ICD-10-CM | POA: Diagnosis not present

## 2019-12-02 DIAGNOSIS — Z0000 Encounter for general adult medical examination without abnormal findings: Secondary | ICD-10-CM | POA: Diagnosis not present

## 2022-08-04 ENCOUNTER — Telehealth: Payer: Self-pay | Admitting: Internal Medicine

## 2022-08-04 NOTE — Telephone Encounter (Unsigned)
Hi Dr. Hilarie Fredrickson,  Supervising provider 06/09/22  Patient called requesting a transfer of care to [facility] from Dr. Marisa Hua office. States they are no longer performing colonoscopies.   [facility] sent records will be sending them to you for review.  Please advise on scheduling.  Thanks

## 2022-08-09 ENCOUNTER — Encounter: Payer: Self-pay | Admitting: Internal Medicine

## 2022-09-05 ENCOUNTER — Ambulatory Visit (AMBULATORY_SURGERY_CENTER): Payer: Self-pay

## 2022-09-05 ENCOUNTER — Telehealth: Payer: Self-pay

## 2022-09-05 VITALS — Ht 71.0 in | Wt 225.0 lb

## 2022-09-05 DIAGNOSIS — Z86010 Personal history of colonic polyps: Secondary | ICD-10-CM

## 2022-09-05 MED ORDER — NA SULFATE-K SULFATE-MG SULF 17.5-3.13-1.6 GM/177ML PO SOLN: 1.0000 | Freq: Once | ORAL | 0 refills | Status: AC

## 2022-09-05 NOTE — Telephone Encounter (Signed)
Patient was seen in [place] and disclosed that he had issues with anesthesia during his last colonoscopy at [facility].  He did not know why the colonoscopy was aborted.  Paperwork states that he had moderate sedation with Midazolam 12.5 mg IV.  His colonoscopy report that was faxed to [place] states that the patients tolerance was poor due to marked pain and manipulation of the endoscope.  Patient was continuously moving and difficult to control. Difficult exam due to intense spasm in the sigmoid colon that did not relax.  The patient's lower colon could not be distended.. Recommendations were that the patient should have propofol with anesthesia support for next procedure.

## 2022-09-05 NOTE — Progress Notes (Signed)
No egg or soy allergy known to patient  No issues known to pt with past sedation with any surgeries or procedures Patient denies ever being told they had issues or difficulty with intubation  No FH of Malignant Hyperthermia Pt is not on diet pills Pt is not on  home 02  Pt is not on blood thinners  Pt denies issues with constipation  No A fib or A flutter Have any cardiac testing pending--no Pt instructed to use [email or web address] or GoodRx for a price reduction on prep   

## 2022-09-06 NOTE — Telephone Encounter (Signed)
Agree Thanks [employer]

## 2022-09-06 NOTE — Telephone Encounter (Signed)
Angel Sampson,  Propofol will provide optimal exam conditions; he is cleared for anesthetic care at [facility].  Thanks much,  Osvaldo Angst

## 2022-10-02 ENCOUNTER — Encounter: Payer: Self-pay | Admitting: Internal Medicine

## 2022-11-01 DIAGNOSIS — I10 Essential (primary) hypertension: Secondary | ICD-10-CM | POA: Diagnosis not present

## 2022-11-01 DIAGNOSIS — G4733 Obstructive sleep apnea (adult) (pediatric): Secondary | ICD-10-CM | POA: Diagnosis not present

## 2022-11-01 DIAGNOSIS — R0989 Other specified symptoms and signs involving the circulatory and respiratory systems: Secondary | ICD-10-CM | POA: Diagnosis not present

## 2022-11-01 DIAGNOSIS — R0609 Other forms of dyspnea: Secondary | ICD-10-CM | POA: Diagnosis not present

## 2022-11-06 DIAGNOSIS — R0609 Other forms of dyspnea: Secondary | ICD-10-CM

## 2022-11-27 DIAGNOSIS — E669 Obesity, unspecified: Secondary | ICD-10-CM | POA: Diagnosis not present

## 2022-11-27 DIAGNOSIS — Z125 Encounter for screening for malignant neoplasm of prostate: Secondary | ICD-10-CM | POA: Diagnosis not present

## 2022-11-27 DIAGNOSIS — Z131 Encounter for screening for diabetes mellitus: Secondary | ICD-10-CM | POA: Diagnosis not present

## 2022-11-27 DIAGNOSIS — E785 Hyperlipidemia, unspecified: Secondary | ICD-10-CM | POA: Diagnosis not present

## 2022-11-27 DIAGNOSIS — Z0000 Encounter for general adult medical examination without abnormal findings: Secondary | ICD-10-CM | POA: Diagnosis not present

## 2023-01-03 DIAGNOSIS — M7051 Other bursitis of knee, right knee: Secondary | ICD-10-CM | POA: Diagnosis not present

## 2023-01-08 DIAGNOSIS — M7041 Prepatellar bursitis, right knee: Secondary | ICD-10-CM | POA: Diagnosis not present

## 2023-03-16 DIAGNOSIS — R079 Chest pain, unspecified: Secondary | ICD-10-CM | POA: Diagnosis not present

## 2023-03-16 DIAGNOSIS — R0789 Other chest pain: Secondary | ICD-10-CM | POA: Diagnosis not present

## 2023-03-16 DIAGNOSIS — R002 Palpitations: Secondary | ICD-10-CM | POA: Diagnosis not present

## 2023-03-16 DIAGNOSIS — R0602 Shortness of breath: Secondary | ICD-10-CM | POA: Diagnosis not present

## 2023-04-11 DIAGNOSIS — R0789 Other chest pain: Secondary | ICD-10-CM | POA: Diagnosis not present

## 2023-04-11 DIAGNOSIS — E7849 Other hyperlipidemia: Secondary | ICD-10-CM | POA: Diagnosis not present

## 2023-04-11 DIAGNOSIS — Z7982 Long term (current) use of aspirin: Secondary | ICD-10-CM | POA: Diagnosis not present

## 2023-04-11 DIAGNOSIS — I10 Essential (primary) hypertension: Secondary | ICD-10-CM | POA: Diagnosis not present

## 2023-04-17 DIAGNOSIS — R0789 Other chest pain: Secondary | ICD-10-CM | POA: Diagnosis not present

## 2023-06-04 DIAGNOSIS — D225 Melanocytic nevi of trunk: Secondary | ICD-10-CM | POA: Diagnosis not present

## 2023-06-04 DIAGNOSIS — L814 Other melanin hyperpigmentation: Secondary | ICD-10-CM | POA: Diagnosis not present

## 2023-06-04 DIAGNOSIS — L578 Other skin changes due to chronic exposure to nonionizing radiation: Secondary | ICD-10-CM | POA: Diagnosis not present

## 2023-06-04 DIAGNOSIS — L570 Actinic keratosis: Secondary | ICD-10-CM | POA: Diagnosis not present

## 2023-07-16 DIAGNOSIS — M1712 Unilateral primary osteoarthritis, left knee: Secondary | ICD-10-CM | POA: Diagnosis not present

## 2023-08-08 DIAGNOSIS — M79609 Pain in unspecified limb: Secondary | ICD-10-CM | POA: Diagnosis not present

## 2023-08-08 DIAGNOSIS — E559 Vitamin D deficiency, unspecified: Secondary | ICD-10-CM | POA: Diagnosis not present

## 2023-08-08 DIAGNOSIS — Z79899 Other long term (current) drug therapy: Secondary | ICD-10-CM | POA: Diagnosis not present

## 2023-08-08 DIAGNOSIS — Z01818 Encounter for other preprocedural examination: Secondary | ICD-10-CM | POA: Diagnosis not present

## 2023-09-04 DIAGNOSIS — M1712 Unilateral primary osteoarthritis, left knee: Secondary | ICD-10-CM | POA: Diagnosis not present

## 2023-09-13 DIAGNOSIS — G8918 Other acute postprocedural pain: Secondary | ICD-10-CM | POA: Diagnosis not present

## 2023-09-13 DIAGNOSIS — E7800 Pure hypercholesterolemia, unspecified: Secondary | ICD-10-CM | POA: Diagnosis not present

## 2023-09-13 DIAGNOSIS — M1712 Unilateral primary osteoarthritis, left knee: Secondary | ICD-10-CM | POA: Diagnosis not present

## 2023-09-13 DIAGNOSIS — Z471 Aftercare following joint replacement surgery: Secondary | ICD-10-CM | POA: Diagnosis not present

## 2023-09-13 DIAGNOSIS — I10 Essential (primary) hypertension: Secondary | ICD-10-CM | POA: Diagnosis not present

## 2023-09-13 DIAGNOSIS — Z860100 Personal history of colon polyps, unspecified: Secondary | ICD-10-CM | POA: Diagnosis not present

## 2023-09-13 DIAGNOSIS — G4733 Obstructive sleep apnea (adult) (pediatric): Secondary | ICD-10-CM | POA: Diagnosis not present

## 2023-09-13 DIAGNOSIS — Z96652 Presence of left artificial knee joint: Secondary | ICD-10-CM | POA: Diagnosis not present

## 2023-09-14 DIAGNOSIS — E7800 Pure hypercholesterolemia, unspecified: Secondary | ICD-10-CM | POA: Diagnosis not present

## 2023-09-14 DIAGNOSIS — M19011 Primary osteoarthritis, right shoulder: Secondary | ICD-10-CM | POA: Diagnosis not present

## 2023-09-14 DIAGNOSIS — Z7982 Long term (current) use of aspirin: Secondary | ICD-10-CM | POA: Diagnosis not present

## 2023-09-14 DIAGNOSIS — T8454XA Infection and inflammatory reaction due to internal left knee prosthesis, initial encounter: Secondary | ICD-10-CM | POA: Diagnosis not present

## 2023-09-14 DIAGNOSIS — M1711 Unilateral primary osteoarthritis, right knee: Secondary | ICD-10-CM | POA: Diagnosis not present

## 2023-09-14 DIAGNOSIS — Z6833 Body mass index (BMI) 33.0-33.9, adult: Secondary | ICD-10-CM | POA: Diagnosis not present

## 2023-09-14 DIAGNOSIS — Z791 Long term (current) use of non-steroidal anti-inflammatories (NSAID): Secondary | ICD-10-CM | POA: Diagnosis not present

## 2023-09-14 DIAGNOSIS — E66811 Obesity, class 1: Secondary | ICD-10-CM | POA: Diagnosis not present

## 2023-09-14 DIAGNOSIS — Z8546 Personal history of malignant neoplasm of prostate: Secondary | ICD-10-CM | POA: Diagnosis not present

## 2023-09-14 DIAGNOSIS — M7501 Adhesive capsulitis of right shoulder: Secondary | ICD-10-CM | POA: Diagnosis not present

## 2023-09-14 DIAGNOSIS — M7041 Prepatellar bursitis, right knee: Secondary | ICD-10-CM | POA: Diagnosis not present

## 2023-09-14 DIAGNOSIS — E559 Vitamin D deficiency, unspecified: Secondary | ICD-10-CM | POA: Diagnosis not present

## 2023-09-14 DIAGNOSIS — G8929 Other chronic pain: Secondary | ICD-10-CM | POA: Diagnosis not present

## 2023-09-14 DIAGNOSIS — G4733 Obstructive sleep apnea (adult) (pediatric): Secondary | ICD-10-CM | POA: Diagnosis not present

## 2023-09-14 DIAGNOSIS — K219 Gastro-esophageal reflux disease without esophagitis: Secondary | ICD-10-CM | POA: Diagnosis not present

## 2023-09-14 DIAGNOSIS — I10 Essential (primary) hypertension: Secondary | ICD-10-CM | POA: Diagnosis not present

## 2023-09-15 DIAGNOSIS — E7800 Pure hypercholesterolemia, unspecified: Secondary | ICD-10-CM | POA: Diagnosis not present

## 2023-09-15 DIAGNOSIS — Z96652 Presence of left artificial knee joint: Secondary | ICD-10-CM | POA: Diagnosis not present

## 2023-09-15 DIAGNOSIS — D72829 Elevated white blood cell count, unspecified: Secondary | ICD-10-CM | POA: Diagnosis not present

## 2023-09-15 DIAGNOSIS — Z79899 Other long term (current) drug therapy: Secondary | ICD-10-CM | POA: Diagnosis not present

## 2023-09-15 DIAGNOSIS — E785 Hyperlipidemia, unspecified: Secondary | ICD-10-CM | POA: Diagnosis not present

## 2023-09-15 DIAGNOSIS — Z6833 Body mass index (BMI) 33.0-33.9, adult: Secondary | ICD-10-CM | POA: Diagnosis not present

## 2023-09-15 DIAGNOSIS — Z791 Long term (current) use of non-steroidal anti-inflammatories (NSAID): Secondary | ICD-10-CM | POA: Diagnosis not present

## 2023-09-15 DIAGNOSIS — T8454XA Infection and inflammatory reaction due to internal left knee prosthesis, initial encounter: Secondary | ICD-10-CM | POA: Diagnosis not present

## 2023-09-15 DIAGNOSIS — Z8546 Personal history of malignant neoplasm of prostate: Secondary | ICD-10-CM | POA: Diagnosis not present

## 2023-09-15 DIAGNOSIS — I10 Essential (primary) hypertension: Secondary | ICD-10-CM | POA: Diagnosis not present

## 2023-09-15 DIAGNOSIS — G4733 Obstructive sleep apnea (adult) (pediatric): Secondary | ICD-10-CM | POA: Diagnosis not present

## 2023-09-15 DIAGNOSIS — D649 Anemia, unspecified: Secondary | ICD-10-CM | POA: Diagnosis not present

## 2023-09-15 DIAGNOSIS — Z79891 Long term (current) use of opiate analgesic: Secondary | ICD-10-CM | POA: Diagnosis not present

## 2023-09-15 DIAGNOSIS — E559 Vitamin D deficiency, unspecified: Secondary | ICD-10-CM | POA: Diagnosis not present

## 2023-09-15 DIAGNOSIS — E66811 Obesity, class 1: Secondary | ICD-10-CM | POA: Diagnosis not present

## 2023-09-15 DIAGNOSIS — M7041 Prepatellar bursitis, right knee: Secondary | ICD-10-CM | POA: Diagnosis not present

## 2023-09-15 DIAGNOSIS — E669 Obesity, unspecified: Secondary | ICD-10-CM | POA: Diagnosis not present

## 2023-09-15 DIAGNOSIS — R5082 Postprocedural fever: Secondary | ICD-10-CM | POA: Diagnosis not present

## 2023-09-15 DIAGNOSIS — Z7982 Long term (current) use of aspirin: Secondary | ICD-10-CM | POA: Diagnosis not present

## 2023-09-15 DIAGNOSIS — M19011 Primary osteoarthritis, right shoulder: Secondary | ICD-10-CM | POA: Diagnosis not present

## 2023-09-15 DIAGNOSIS — R739 Hyperglycemia, unspecified: Secondary | ICD-10-CM | POA: Diagnosis not present

## 2023-09-15 DIAGNOSIS — K219 Gastro-esophageal reflux disease without esophagitis: Secondary | ICD-10-CM | POA: Diagnosis not present

## 2023-09-15 DIAGNOSIS — M7501 Adhesive capsulitis of right shoulder: Secondary | ICD-10-CM | POA: Diagnosis not present

## 2023-09-15 DIAGNOSIS — M1990 Unspecified osteoarthritis, unspecified site: Secondary | ICD-10-CM | POA: Diagnosis not present

## 2023-09-15 DIAGNOSIS — E876 Hypokalemia: Secondary | ICD-10-CM | POA: Diagnosis not present

## 2023-09-15 DIAGNOSIS — M1711 Unilateral primary osteoarthritis, right knee: Secondary | ICD-10-CM | POA: Diagnosis not present

## 2023-09-15 DIAGNOSIS — G8929 Other chronic pain: Secondary | ICD-10-CM | POA: Diagnosis not present

## 2023-09-16 DIAGNOSIS — T8454XA Infection and inflammatory reaction due to internal left knee prosthesis, initial encounter: Secondary | ICD-10-CM | POA: Diagnosis not present

## 2023-09-16 DIAGNOSIS — E876 Hypokalemia: Secondary | ICD-10-CM | POA: Diagnosis not present

## 2023-09-16 DIAGNOSIS — R739 Hyperglycemia, unspecified: Secondary | ICD-10-CM | POA: Diagnosis not present

## 2023-09-17 DIAGNOSIS — K219 Gastro-esophageal reflux disease without esophagitis: Secondary | ICD-10-CM | POA: Diagnosis not present

## 2023-09-17 DIAGNOSIS — T8454XA Infection and inflammatory reaction due to internal left knee prosthesis, initial encounter: Secondary | ICD-10-CM | POA: Diagnosis not present

## 2023-09-17 DIAGNOSIS — Z791 Long term (current) use of non-steroidal anti-inflammatories (NSAID): Secondary | ICD-10-CM | POA: Diagnosis not present

## 2023-09-17 DIAGNOSIS — I10 Essential (primary) hypertension: Secondary | ICD-10-CM | POA: Diagnosis not present

## 2023-09-17 DIAGNOSIS — M7501 Adhesive capsulitis of right shoulder: Secondary | ICD-10-CM | POA: Diagnosis not present

## 2023-09-17 DIAGNOSIS — Z7982 Long term (current) use of aspirin: Secondary | ICD-10-CM | POA: Diagnosis not present

## 2023-09-17 DIAGNOSIS — E66811 Obesity, class 1: Secondary | ICD-10-CM | POA: Diagnosis not present

## 2023-09-17 DIAGNOSIS — E559 Vitamin D deficiency, unspecified: Secondary | ICD-10-CM | POA: Diagnosis not present

## 2023-09-17 DIAGNOSIS — G4733 Obstructive sleep apnea (adult) (pediatric): Secondary | ICD-10-CM | POA: Diagnosis not present

## 2023-09-17 DIAGNOSIS — E7800 Pure hypercholesterolemia, unspecified: Secondary | ICD-10-CM | POA: Diagnosis not present

## 2023-09-17 DIAGNOSIS — Z6833 Body mass index (BMI) 33.0-33.9, adult: Secondary | ICD-10-CM | POA: Diagnosis not present

## 2023-09-17 DIAGNOSIS — M1711 Unilateral primary osteoarthritis, right knee: Secondary | ICD-10-CM | POA: Diagnosis not present

## 2023-09-17 DIAGNOSIS — G8929 Other chronic pain: Secondary | ICD-10-CM | POA: Diagnosis not present

## 2023-09-17 DIAGNOSIS — Z8546 Personal history of malignant neoplasm of prostate: Secondary | ICD-10-CM | POA: Diagnosis not present

## 2023-09-17 DIAGNOSIS — M19011 Primary osteoarthritis, right shoulder: Secondary | ICD-10-CM | POA: Diagnosis not present

## 2023-09-17 DIAGNOSIS — M7041 Prepatellar bursitis, right knee: Secondary | ICD-10-CM | POA: Diagnosis not present

## 2023-09-19 DIAGNOSIS — M1711 Unilateral primary osteoarthritis, right knee: Secondary | ICD-10-CM | POA: Diagnosis not present

## 2023-09-19 DIAGNOSIS — G4733 Obstructive sleep apnea (adult) (pediatric): Secondary | ICD-10-CM | POA: Diagnosis not present

## 2023-09-19 DIAGNOSIS — M19011 Primary osteoarthritis, right shoulder: Secondary | ICD-10-CM | POA: Diagnosis not present

## 2023-09-19 DIAGNOSIS — E559 Vitamin D deficiency, unspecified: Secondary | ICD-10-CM | POA: Diagnosis not present

## 2023-09-19 DIAGNOSIS — T8454XA Infection and inflammatory reaction due to internal left knee prosthesis, initial encounter: Secondary | ICD-10-CM | POA: Diagnosis not present

## 2023-09-19 DIAGNOSIS — E66811 Obesity, class 1: Secondary | ICD-10-CM | POA: Diagnosis not present

## 2023-09-19 DIAGNOSIS — M7041 Prepatellar bursitis, right knee: Secondary | ICD-10-CM | POA: Diagnosis not present

## 2023-09-19 DIAGNOSIS — G8929 Other chronic pain: Secondary | ICD-10-CM | POA: Diagnosis not present

## 2023-09-19 DIAGNOSIS — Z791 Long term (current) use of non-steroidal anti-inflammatories (NSAID): Secondary | ICD-10-CM | POA: Diagnosis not present

## 2023-09-19 DIAGNOSIS — Z7982 Long term (current) use of aspirin: Secondary | ICD-10-CM | POA: Diagnosis not present

## 2023-09-19 DIAGNOSIS — M7501 Adhesive capsulitis of right shoulder: Secondary | ICD-10-CM | POA: Diagnosis not present

## 2023-09-19 DIAGNOSIS — Z8546 Personal history of malignant neoplasm of prostate: Secondary | ICD-10-CM | POA: Diagnosis not present

## 2023-09-19 DIAGNOSIS — Z6833 Body mass index (BMI) 33.0-33.9, adult: Secondary | ICD-10-CM | POA: Diagnosis not present

## 2023-09-19 DIAGNOSIS — E7800 Pure hypercholesterolemia, unspecified: Secondary | ICD-10-CM | POA: Diagnosis not present

## 2023-09-19 DIAGNOSIS — K219 Gastro-esophageal reflux disease without esophagitis: Secondary | ICD-10-CM | POA: Diagnosis not present

## 2023-09-19 DIAGNOSIS — I10 Essential (primary) hypertension: Secondary | ICD-10-CM | POA: Diagnosis not present

## 2023-09-21 DIAGNOSIS — E559 Vitamin D deficiency, unspecified: Secondary | ICD-10-CM | POA: Diagnosis not present

## 2023-09-21 DIAGNOSIS — M7041 Prepatellar bursitis, right knee: Secondary | ICD-10-CM | POA: Diagnosis not present

## 2023-09-21 DIAGNOSIS — Z791 Long term (current) use of non-steroidal anti-inflammatories (NSAID): Secondary | ICD-10-CM | POA: Diagnosis not present

## 2023-09-21 DIAGNOSIS — K219 Gastro-esophageal reflux disease without esophagitis: Secondary | ICD-10-CM | POA: Diagnosis not present

## 2023-09-21 DIAGNOSIS — M19011 Primary osteoarthritis, right shoulder: Secondary | ICD-10-CM | POA: Diagnosis not present

## 2023-09-21 DIAGNOSIS — M7501 Adhesive capsulitis of right shoulder: Secondary | ICD-10-CM | POA: Diagnosis not present

## 2023-09-21 DIAGNOSIS — G4733 Obstructive sleep apnea (adult) (pediatric): Secondary | ICD-10-CM | POA: Diagnosis not present

## 2023-09-21 DIAGNOSIS — E66811 Obesity, class 1: Secondary | ICD-10-CM | POA: Diagnosis not present

## 2023-09-21 DIAGNOSIS — I10 Essential (primary) hypertension: Secondary | ICD-10-CM | POA: Diagnosis not present

## 2023-09-21 DIAGNOSIS — Z8546 Personal history of malignant neoplasm of prostate: Secondary | ICD-10-CM | POA: Diagnosis not present

## 2023-09-21 DIAGNOSIS — Z7982 Long term (current) use of aspirin: Secondary | ICD-10-CM | POA: Diagnosis not present

## 2023-09-21 DIAGNOSIS — Z6833 Body mass index (BMI) 33.0-33.9, adult: Secondary | ICD-10-CM | POA: Diagnosis not present

## 2023-09-21 DIAGNOSIS — E7800 Pure hypercholesterolemia, unspecified: Secondary | ICD-10-CM | POA: Diagnosis not present

## 2023-09-21 DIAGNOSIS — M1711 Unilateral primary osteoarthritis, right knee: Secondary | ICD-10-CM | POA: Diagnosis not present

## 2023-09-21 DIAGNOSIS — T8454XA Infection and inflammatory reaction due to internal left knee prosthesis, initial encounter: Secondary | ICD-10-CM | POA: Diagnosis not present

## 2023-09-21 DIAGNOSIS — G8929 Other chronic pain: Secondary | ICD-10-CM | POA: Diagnosis not present

## 2023-09-24 DIAGNOSIS — Z6833 Body mass index (BMI) 33.0-33.9, adult: Secondary | ICD-10-CM | POA: Diagnosis not present

## 2023-09-24 DIAGNOSIS — E7800 Pure hypercholesterolemia, unspecified: Secondary | ICD-10-CM | POA: Diagnosis not present

## 2023-09-24 DIAGNOSIS — Z7982 Long term (current) use of aspirin: Secondary | ICD-10-CM | POA: Diagnosis not present

## 2023-09-24 DIAGNOSIS — G8929 Other chronic pain: Secondary | ICD-10-CM | POA: Diagnosis not present

## 2023-09-24 DIAGNOSIS — M7041 Prepatellar bursitis, right knee: Secondary | ICD-10-CM | POA: Diagnosis not present

## 2023-09-24 DIAGNOSIS — K219 Gastro-esophageal reflux disease without esophagitis: Secondary | ICD-10-CM | POA: Diagnosis not present

## 2023-09-24 DIAGNOSIS — T8454XA Infection and inflammatory reaction due to internal left knee prosthesis, initial encounter: Secondary | ICD-10-CM | POA: Diagnosis not present

## 2023-09-24 DIAGNOSIS — Z8546 Personal history of malignant neoplasm of prostate: Secondary | ICD-10-CM | POA: Diagnosis not present

## 2023-09-24 DIAGNOSIS — E66811 Obesity, class 1: Secondary | ICD-10-CM | POA: Diagnosis not present

## 2023-09-24 DIAGNOSIS — M1711 Unilateral primary osteoarthritis, right knee: Secondary | ICD-10-CM | POA: Diagnosis not present

## 2023-09-24 DIAGNOSIS — E559 Vitamin D deficiency, unspecified: Secondary | ICD-10-CM | POA: Diagnosis not present

## 2023-09-24 DIAGNOSIS — M19011 Primary osteoarthritis, right shoulder: Secondary | ICD-10-CM | POA: Diagnosis not present

## 2023-09-24 DIAGNOSIS — I10 Essential (primary) hypertension: Secondary | ICD-10-CM | POA: Diagnosis not present

## 2023-09-24 DIAGNOSIS — M7501 Adhesive capsulitis of right shoulder: Secondary | ICD-10-CM | POA: Diagnosis not present

## 2023-09-24 DIAGNOSIS — Z791 Long term (current) use of non-steroidal anti-inflammatories (NSAID): Secondary | ICD-10-CM | POA: Diagnosis not present

## 2023-09-24 DIAGNOSIS — G4733 Obstructive sleep apnea (adult) (pediatric): Secondary | ICD-10-CM | POA: Diagnosis not present

## 2023-09-27 DIAGNOSIS — M25462 Effusion, left knee: Secondary | ICD-10-CM | POA: Diagnosis not present

## 2023-09-27 DIAGNOSIS — M25662 Stiffness of left knee, not elsewhere classified: Secondary | ICD-10-CM | POA: Diagnosis not present

## 2023-09-27 DIAGNOSIS — R2689 Other abnormalities of gait and mobility: Secondary | ICD-10-CM | POA: Diagnosis not present

## 2023-09-27 DIAGNOSIS — M25562 Pain in left knee: Secondary | ICD-10-CM | POA: Diagnosis not present

## 2023-10-02 DIAGNOSIS — R2689 Other abnormalities of gait and mobility: Secondary | ICD-10-CM | POA: Diagnosis not present

## 2023-10-02 DIAGNOSIS — M25462 Effusion, left knee: Secondary | ICD-10-CM | POA: Diagnosis not present

## 2023-10-02 DIAGNOSIS — M25562 Pain in left knee: Secondary | ICD-10-CM | POA: Diagnosis not present

## 2023-10-02 DIAGNOSIS — M25662 Stiffness of left knee, not elsewhere classified: Secondary | ICD-10-CM | POA: Diagnosis not present

## 2023-10-05 DIAGNOSIS — M25662 Stiffness of left knee, not elsewhere classified: Secondary | ICD-10-CM | POA: Diagnosis not present

## 2023-10-05 DIAGNOSIS — R2689 Other abnormalities of gait and mobility: Secondary | ICD-10-CM | POA: Diagnosis not present

## 2023-10-05 DIAGNOSIS — M25462 Effusion, left knee: Secondary | ICD-10-CM | POA: Diagnosis not present

## 2023-10-05 DIAGNOSIS — M25562 Pain in left knee: Secondary | ICD-10-CM | POA: Diagnosis not present

## 2023-10-08 DIAGNOSIS — R2689 Other abnormalities of gait and mobility: Secondary | ICD-10-CM | POA: Diagnosis not present

## 2023-10-08 DIAGNOSIS — M25462 Effusion, left knee: Secondary | ICD-10-CM | POA: Diagnosis not present

## 2023-10-08 DIAGNOSIS — M25662 Stiffness of left knee, not elsewhere classified: Secondary | ICD-10-CM | POA: Diagnosis not present

## 2023-10-08 DIAGNOSIS — M25562 Pain in left knee: Secondary | ICD-10-CM | POA: Diagnosis not present

## 2023-10-11 DIAGNOSIS — R2689 Other abnormalities of gait and mobility: Secondary | ICD-10-CM | POA: Diagnosis not present

## 2023-10-11 DIAGNOSIS — M25462 Effusion, left knee: Secondary | ICD-10-CM | POA: Diagnosis not present

## 2023-10-11 DIAGNOSIS — M25662 Stiffness of left knee, not elsewhere classified: Secondary | ICD-10-CM | POA: Diagnosis not present

## 2023-10-11 DIAGNOSIS — M25562 Pain in left knee: Secondary | ICD-10-CM | POA: Diagnosis not present

## 2023-10-15 DIAGNOSIS — M25662 Stiffness of left knee, not elsewhere classified: Secondary | ICD-10-CM | POA: Diagnosis not present

## 2023-10-15 DIAGNOSIS — R2689 Other abnormalities of gait and mobility: Secondary | ICD-10-CM | POA: Diagnosis not present

## 2023-10-15 DIAGNOSIS — M25462 Effusion, left knee: Secondary | ICD-10-CM | POA: Diagnosis not present

## 2023-10-15 DIAGNOSIS — M25562 Pain in left knee: Secondary | ICD-10-CM | POA: Diagnosis not present

## 2023-10-19 DIAGNOSIS — Z6831 Body mass index (BMI) 31.0-31.9, adult: Secondary | ICD-10-CM | POA: Diagnosis not present

## 2023-10-19 DIAGNOSIS — H6691 Otitis media, unspecified, right ear: Secondary | ICD-10-CM | POA: Diagnosis not present

## 2023-10-22 DIAGNOSIS — R2689 Other abnormalities of gait and mobility: Secondary | ICD-10-CM | POA: Diagnosis not present

## 2023-10-22 DIAGNOSIS — M25562 Pain in left knee: Secondary | ICD-10-CM | POA: Diagnosis not present

## 2023-10-22 DIAGNOSIS — M25462 Effusion, left knee: Secondary | ICD-10-CM | POA: Diagnosis not present

## 2023-10-22 DIAGNOSIS — M25662 Stiffness of left knee, not elsewhere classified: Secondary | ICD-10-CM | POA: Diagnosis not present

## 2023-10-24 DIAGNOSIS — M1712 Unilateral primary osteoarthritis, left knee: Secondary | ICD-10-CM | POA: Diagnosis not present

## 2023-10-25 DIAGNOSIS — M25662 Stiffness of left knee, not elsewhere classified: Secondary | ICD-10-CM | POA: Diagnosis not present

## 2023-10-25 DIAGNOSIS — M25462 Effusion, left knee: Secondary | ICD-10-CM | POA: Diagnosis not present

## 2023-10-25 DIAGNOSIS — M25562 Pain in left knee: Secondary | ICD-10-CM | POA: Diagnosis not present

## 2023-10-25 DIAGNOSIS — R2689 Other abnormalities of gait and mobility: Secondary | ICD-10-CM | POA: Diagnosis not present

## 2023-10-29 DIAGNOSIS — M25462 Effusion, left knee: Secondary | ICD-10-CM | POA: Diagnosis not present

## 2023-10-29 DIAGNOSIS — M25662 Stiffness of left knee, not elsewhere classified: Secondary | ICD-10-CM | POA: Diagnosis not present

## 2023-10-29 DIAGNOSIS — M25562 Pain in left knee: Secondary | ICD-10-CM | POA: Diagnosis not present

## 2023-10-29 DIAGNOSIS — R2689 Other abnormalities of gait and mobility: Secondary | ICD-10-CM | POA: Diagnosis not present

## 2023-11-01 DIAGNOSIS — M25462 Effusion, left knee: Secondary | ICD-10-CM | POA: Diagnosis not present

## 2023-11-01 DIAGNOSIS — R2689 Other abnormalities of gait and mobility: Secondary | ICD-10-CM | POA: Diagnosis not present

## 2023-11-01 DIAGNOSIS — M25562 Pain in left knee: Secondary | ICD-10-CM | POA: Diagnosis not present

## 2023-11-01 DIAGNOSIS — M25662 Stiffness of left knee, not elsewhere classified: Secondary | ICD-10-CM | POA: Diagnosis not present

## 2023-11-06 DIAGNOSIS — R2689 Other abnormalities of gait and mobility: Secondary | ICD-10-CM | POA: Diagnosis not present

## 2023-11-06 DIAGNOSIS — M25462 Effusion, left knee: Secondary | ICD-10-CM | POA: Diagnosis not present

## 2023-11-06 DIAGNOSIS — M25662 Stiffness of left knee, not elsewhere classified: Secondary | ICD-10-CM | POA: Diagnosis not present

## 2023-11-06 DIAGNOSIS — M25562 Pain in left knee: Secondary | ICD-10-CM | POA: Diagnosis not present

## 2023-11-09 DIAGNOSIS — M25562 Pain in left knee: Secondary | ICD-10-CM | POA: Diagnosis not present

## 2023-11-09 DIAGNOSIS — M25662 Stiffness of left knee, not elsewhere classified: Secondary | ICD-10-CM | POA: Diagnosis not present

## 2023-11-09 DIAGNOSIS — R2689 Other abnormalities of gait and mobility: Secondary | ICD-10-CM | POA: Diagnosis not present

## 2023-11-09 DIAGNOSIS — M25462 Effusion, left knee: Secondary | ICD-10-CM | POA: Diagnosis not present

## 2023-11-13 DIAGNOSIS — M25562 Pain in left knee: Secondary | ICD-10-CM | POA: Diagnosis not present

## 2023-11-13 DIAGNOSIS — M25462 Effusion, left knee: Secondary | ICD-10-CM | POA: Diagnosis not present

## 2023-11-13 DIAGNOSIS — R2689 Other abnormalities of gait and mobility: Secondary | ICD-10-CM | POA: Diagnosis not present

## 2023-11-13 DIAGNOSIS — M25662 Stiffness of left knee, not elsewhere classified: Secondary | ICD-10-CM | POA: Diagnosis not present

## 2023-11-15 DIAGNOSIS — M25462 Effusion, left knee: Secondary | ICD-10-CM | POA: Diagnosis not present

## 2023-11-15 DIAGNOSIS — M25562 Pain in left knee: Secondary | ICD-10-CM | POA: Diagnosis not present

## 2023-11-15 DIAGNOSIS — M25662 Stiffness of left knee, not elsewhere classified: Secondary | ICD-10-CM | POA: Diagnosis not present

## 2023-11-15 DIAGNOSIS — R2689 Other abnormalities of gait and mobility: Secondary | ICD-10-CM | POA: Diagnosis not present

## 2023-12-05 DIAGNOSIS — Z125 Encounter for screening for malignant neoplasm of prostate: Secondary | ICD-10-CM | POA: Diagnosis not present

## 2023-12-05 DIAGNOSIS — E669 Obesity, unspecified: Secondary | ICD-10-CM | POA: Diagnosis not present

## 2023-12-05 DIAGNOSIS — Z131 Encounter for screening for diabetes mellitus: Secondary | ICD-10-CM | POA: Diagnosis not present

## 2023-12-05 DIAGNOSIS — Z0000 Encounter for general adult medical examination without abnormal findings: Secondary | ICD-10-CM | POA: Diagnosis not present

## 2023-12-05 DIAGNOSIS — Z13220 Encounter for screening for lipoid disorders: Secondary | ICD-10-CM | POA: Diagnosis not present

## 2023-12-31 DIAGNOSIS — M65342 Trigger finger, left ring finger: Secondary | ICD-10-CM | POA: Diagnosis not present

## 2024-01-09 DIAGNOSIS — B351 Tinea unguium: Secondary | ICD-10-CM | POA: Diagnosis not present

## 2024-01-10 DIAGNOSIS — B351 Tinea unguium: Secondary | ICD-10-CM | POA: Diagnosis not present

## 2024-02-08 DIAGNOSIS — B351 Tinea unguium: Secondary | ICD-10-CM | POA: Diagnosis not present

## 2024-02-13 DIAGNOSIS — K219 Gastro-esophageal reflux disease without esophagitis: Secondary | ICD-10-CM | POA: Diagnosis not present

## 2024-03-10 DIAGNOSIS — M1712 Unilateral primary osteoarthritis, left knee: Secondary | ICD-10-CM | POA: Diagnosis not present

## 2024-03-21 DIAGNOSIS — B351 Tinea unguium: Secondary | ICD-10-CM | POA: Diagnosis not present

## 2024-04-11 DIAGNOSIS — L821 Other seborrheic keratosis: Secondary | ICD-10-CM | POA: Diagnosis not present

## 2024-04-11 DIAGNOSIS — L578 Other skin changes due to chronic exposure to nonionizing radiation: Secondary | ICD-10-CM | POA: Diagnosis not present

## 2024-04-11 DIAGNOSIS — L820 Inflamed seborrheic keratosis: Secondary | ICD-10-CM | POA: Diagnosis not present

## 2024-04-11 DIAGNOSIS — D225 Melanocytic nevi of trunk: Secondary | ICD-10-CM | POA: Diagnosis not present

## 2024-04-11 DIAGNOSIS — L814 Other melanin hyperpigmentation: Secondary | ICD-10-CM | POA: Diagnosis not present

## 2024-04-15 DIAGNOSIS — Z96652 Presence of left artificial knee joint: Secondary | ICD-10-CM | POA: Diagnosis not present

## 2024-04-15 DIAGNOSIS — M1712 Unilateral primary osteoarthritis, left knee: Secondary | ICD-10-CM | POA: Diagnosis not present

## 2024-04-18 DIAGNOSIS — Z860100 Personal history of colon polyps, unspecified: Secondary | ICD-10-CM | POA: Diagnosis not present

## 2024-04-18 DIAGNOSIS — Z885 Allergy status to narcotic agent status: Secondary | ICD-10-CM | POA: Diagnosis not present

## 2024-04-18 DIAGNOSIS — Z800 Family history of malignant neoplasm of digestive organs: Secondary | ICD-10-CM | POA: Diagnosis not present

## 2024-04-18 DIAGNOSIS — K5730 Diverticulosis of large intestine without perforation or abscess without bleeding: Secondary | ICD-10-CM | POA: Diagnosis not present

## 2024-04-18 DIAGNOSIS — Z1211 Encounter for screening for malignant neoplasm of colon: Secondary | ICD-10-CM | POA: Diagnosis not present

## 2024-04-18 DIAGNOSIS — Z9079 Acquired absence of other genital organ(s): Secondary | ICD-10-CM | POA: Diagnosis not present

## 2024-04-18 DIAGNOSIS — D122 Benign neoplasm of ascending colon: Secondary | ICD-10-CM | POA: Diagnosis not present

## 2024-04-18 DIAGNOSIS — K635 Polyp of colon: Secondary | ICD-10-CM | POA: Diagnosis not present

## 2024-04-18 DIAGNOSIS — I10 Essential (primary) hypertension: Secondary | ICD-10-CM | POA: Diagnosis not present

## 2024-04-18 DIAGNOSIS — K5750 Diverticulosis of both small and large intestine without perforation or abscess without bleeding: Secondary | ICD-10-CM | POA: Diagnosis not present

## 2024-04-18 DIAGNOSIS — Z8546 Personal history of malignant neoplasm of prostate: Secondary | ICD-10-CM | POA: Diagnosis not present

## 2024-04-18 DIAGNOSIS — K648 Other hemorrhoids: Secondary | ICD-10-CM | POA: Diagnosis not present

## 2024-05-30 DIAGNOSIS — B351 Tinea unguium: Secondary | ICD-10-CM | POA: Diagnosis not present

## 2024-05-30 DIAGNOSIS — M79675 Pain in left toe(s): Secondary | ICD-10-CM | POA: Diagnosis not present

## 2024-06-19 DIAGNOSIS — Z23 Encounter for immunization: Secondary | ICD-10-CM | POA: Diagnosis not present
